# Patient Record
Sex: Female | Born: 1970 | Race: Black or African American | Hispanic: No | Marital: Single
Health system: Southern US, Community
[De-identification: ages and names within clinical notes are randomized; demographics above are authoritative.]

## PROBLEM LIST (undated history)

## (undated) DIAGNOSIS — E7800 Pure hypercholesterolemia, unspecified: Secondary | ICD-10-CM

## (undated) DIAGNOSIS — J302 Other seasonal allergic rhinitis: Secondary | ICD-10-CM

## (undated) HISTORY — DX: Pure hypercholesterolemia, unspecified: E78.00

## (undated) HISTORY — DX: Other seasonal allergic rhinitis: J30.2

## (undated) HISTORY — PX: TUBAL LIGATION: SHX77

---

## 1998-07-26 ENCOUNTER — Inpatient Hospital Stay (HOSPITAL_COMMUNITY): Admission: AD | Admit: 1998-07-26 | Discharge: 1998-07-28 | Payer: Self-pay | Admitting: Obstetrics and Gynecology

## 2000-08-01 ENCOUNTER — Ambulatory Visit (HOSPITAL_COMMUNITY): Admission: RE | Admit: 2000-08-01 | Discharge: 2000-08-01 | Payer: Self-pay | Admitting: Obstetrics

## 2000-09-13 ENCOUNTER — Inpatient Hospital Stay (HOSPITAL_COMMUNITY): Admission: AD | Admit: 2000-09-13 | Discharge: 2000-09-16 | Payer: Self-pay | Admitting: *Deleted

## 2001-03-13 ENCOUNTER — Emergency Department (HOSPITAL_COMMUNITY): Admission: EM | Admit: 2001-03-13 | Discharge: 2001-03-13 | Payer: Self-pay | Admitting: Emergency Medicine

## 2001-03-13 ENCOUNTER — Encounter: Payer: Self-pay | Admitting: Emergency Medicine

## 2012-12-30 ENCOUNTER — Ambulatory Visit (INDEPENDENT_AMBULATORY_CARE_PROVIDER_SITE_OTHER): Payer: 59 | Admitting: Obstetrics

## 2012-12-30 ENCOUNTER — Encounter: Payer: Self-pay | Admitting: Obstetrics

## 2012-12-30 VITALS — BP 110/72 | HR 62 | Temp 97.4°F | Ht 63.0 in | Wt 178.0 lb

## 2012-12-30 DIAGNOSIS — N921 Excessive and frequent menstruation with irregular cycle: Secondary | ICD-10-CM | POA: Insufficient documentation

## 2012-12-30 DIAGNOSIS — Z113 Encounter for screening for infections with a predominantly sexual mode of transmission: Secondary | ICD-10-CM

## 2012-12-30 DIAGNOSIS — Z01419 Encounter for gynecological examination (general) (routine) without abnormal findings: Secondary | ICD-10-CM

## 2012-12-30 NOTE — Progress Notes (Signed)
Subjective:     Erin Olsen is a 42 y.o. female here for a routine exam.  Current complaints: irregular cycles.     Gynecologic History Patient's last menstrual period was 12/30/2012. Contraception: tubal ligation Last Pap: 08/27/2008. Results were: normal Last mammogram: n/a. Results were: n/a  Obstetric History OB History   Grav Para Term Preterm Abortions TAB SAB Ect Mult Living                   The following portions of the patient's history were reviewed and updated as appropriate: allergies, current medications, past family history, past medical history, past social history, past surgical history and problem list.  Review of Systems Pertinent items are noted in HPI.    Objective:    General appearance: alert and no distress Breasts: normal appearance, no masses or tenderness Abdomen: normal findings: soft, non-tender Pelvic: cervix normal in appearance, external genitalia normal, no adnexal masses or tenderness, no cervical motion tenderness, uterus normal size, shape, and consistency and vagina normal without discharge    Assessment:    Healthy female exam.    Plan:    Follow up in: 1 year.

## 2012-12-31 LAB — GC/CHLAMYDIA PROBE AMP
CT Probe RNA: NEGATIVE
GC Probe RNA: NEGATIVE

## 2012-12-31 LAB — PAP IG W/ RFLX HPV ASCU

## 2012-12-31 LAB — HIV ANTIBODY (ROUTINE TESTING W REFLEX): HIV: NONREACTIVE

## 2012-12-31 LAB — RPR

## 2012-12-31 LAB — HEPATITIS B SURFACE ANTIGEN: Hepatitis B Surface Ag: NEGATIVE

## 2013-01-01 ENCOUNTER — Encounter: Payer: Self-pay | Admitting: Obstetrics

## 2013-01-07 ENCOUNTER — Ambulatory Visit (HOSPITAL_COMMUNITY)
Admission: RE | Admit: 2013-01-07 | Discharge: 2013-01-07 | Disposition: A | Discharge status: Home / Self Care | Payer: 59 | Source: Ambulatory Visit | Attending: Obstetrics | Admitting: Obstetrics

## 2013-01-07 DIAGNOSIS — N920 Excessive and frequent menstruation with regular cycle: Secondary | ICD-10-CM | POA: Insufficient documentation

## 2013-01-07 DIAGNOSIS — D259 Leiomyoma of uterus, unspecified: Secondary | ICD-10-CM | POA: Insufficient documentation

## 2013-01-07 DIAGNOSIS — Z01419 Encounter for gynecological examination (general) (routine) without abnormal findings: Secondary | ICD-10-CM

## 2013-01-07 DIAGNOSIS — N921 Excessive and frequent menstruation with irregular cycle: Secondary | ICD-10-CM

## 2013-01-08 ENCOUNTER — Other Ambulatory Visit: Payer: Self-pay | Admitting: Obstetrics

## 2013-01-10 ENCOUNTER — Other Ambulatory Visit: Payer: Self-pay | Admitting: Obstetrics

## 2013-01-10 DIAGNOSIS — R928 Other abnormal and inconclusive findings on diagnostic imaging of breast: Secondary | ICD-10-CM

## 2013-01-21 ENCOUNTER — Other Ambulatory Visit: Payer: Self-pay | Admitting: *Deleted

## 2013-01-21 DIAGNOSIS — N921 Excessive and frequent menstruation with irregular cycle: Secondary | ICD-10-CM

## 2013-01-22 ENCOUNTER — Ambulatory Visit (INDEPENDENT_AMBULATORY_CARE_PROVIDER_SITE_OTHER): Payer: 59

## 2013-01-22 ENCOUNTER — Ambulatory Visit (INDEPENDENT_AMBULATORY_CARE_PROVIDER_SITE_OTHER): Payer: 59 | Admitting: Obstetrics

## 2013-01-22 ENCOUNTER — Other Ambulatory Visit: Payer: Self-pay | Admitting: Obstetrics

## 2013-01-22 VITALS — BP 108/72 | HR 73 | Temp 98.2°F | Wt 175.0 lb

## 2013-01-22 DIAGNOSIS — N946 Dysmenorrhea, unspecified: Secondary | ICD-10-CM

## 2013-01-22 DIAGNOSIS — B9689 Other specified bacterial agents as the cause of diseases classified elsewhere: Secondary | ICD-10-CM

## 2013-01-22 DIAGNOSIS — N8500 Endometrial hyperplasia, unspecified: Secondary | ICD-10-CM

## 2013-01-22 DIAGNOSIS — N921 Excessive and frequent menstruation with irregular cycle: Secondary | ICD-10-CM

## 2013-01-22 DIAGNOSIS — Z3202 Encounter for pregnancy test, result negative: Secondary | ICD-10-CM

## 2013-01-22 DIAGNOSIS — N760 Acute vaginitis: Secondary | ICD-10-CM

## 2013-01-22 DIAGNOSIS — D259 Leiomyoma of uterus, unspecified: Secondary | ICD-10-CM

## 2013-01-22 DIAGNOSIS — Z01812 Encounter for preprocedural laboratory examination: Secondary | ICD-10-CM

## 2013-01-22 LAB — POCT URINE PREGNANCY: Preg Test, Ur: NEGATIVE

## 2013-01-22 MED ORDER — DOXYCYCLINE HYCLATE 50 MG PO CAPS: 50.0000 mg | ORAL_CAPSULE | Freq: Two times a day (BID) | ORAL | Status: AC

## 2013-01-22 MED ORDER — METRONIDAZOLE 500 MG PO TABS
500.0000 mg | ORAL_TABLET | Freq: Two times a day (BID) | ORAL | Status: DC
Start: 2013-01-22 — End: 2014-11-20

## 2013-01-22 NOTE — Progress Notes (Signed)
SONOHYSTEROGRAM PROCEDURE NOTE  SONOHYSTEROGRAM PROCEDURE:   A time-out was performed confirming patient name, allergy status and procedure.  A UPT was negative.  A Graves speculum was placed in the vagina.  The cervix was prepped with betadine.  The cervix was grasped with a single tooth tenaculum.  The IUI catherter was primed with sterile water.  The IUI was introduced into the uterine cavity.  The single tooth tenaculum and speculum were removed.  The vaginal probe was placed.  The sterile water was instilled in the uterine cavity.   The 60ml syringe was removed and a 10ml syringe was attached to the IUI and an endometrial biopsy was obtained with aspiration and submitted to pathology. The IUI was removed.  The single tooth tenaculum was removed with minimal bleeding noted from the cervix.  The patient tolerated the procedure well.  FINDINGS:  Normal endometrial cavity.  No polyps or submucous fibroids seen.

## 2013-01-23 ENCOUNTER — Encounter: Payer: Self-pay | Admitting: Obstetrics

## 2013-01-23 LAB — WET PREP BY MOLECULAR PROBE
Candida species: NEGATIVE
Gardnerella vaginalis: POSITIVE — AB

## 2013-01-28 ENCOUNTER — Ambulatory Visit
Admission: RE | Admit: 2013-01-28 | Discharge: 2013-01-28 | Disposition: A | Discharge status: Home / Self Care | Payer: 59 | Source: Ambulatory Visit | Attending: Obstetrics | Admitting: Obstetrics

## 2013-01-28 DIAGNOSIS — R928 Other abnormal and inconclusive findings on diagnostic imaging of breast: Secondary | ICD-10-CM

## 2013-02-03 ENCOUNTER — Encounter: Payer: Self-pay | Admitting: Obstetrics

## 2013-12-29 ENCOUNTER — Ambulatory Visit: Payer: 59 | Admitting: Obstetrics

## 2014-09-10 IMAGING — US US PELVIS COMPLETE
1 series · 13 of 25 positions shown · non-contrast
Comparison: None

CLINICAL DATA: Menorrhagia.  LMP 12/30/2012.



[Series 1: us pelvis complete · 13 of 51 slices shown]
[im 1/51]
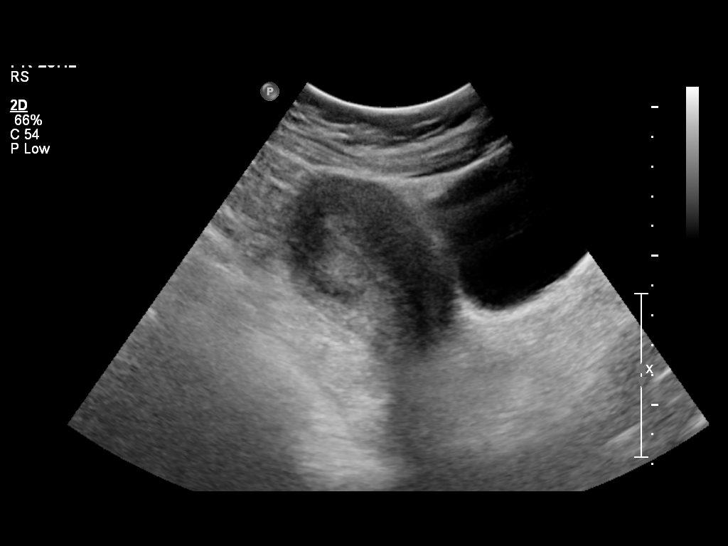
[im 5/51]
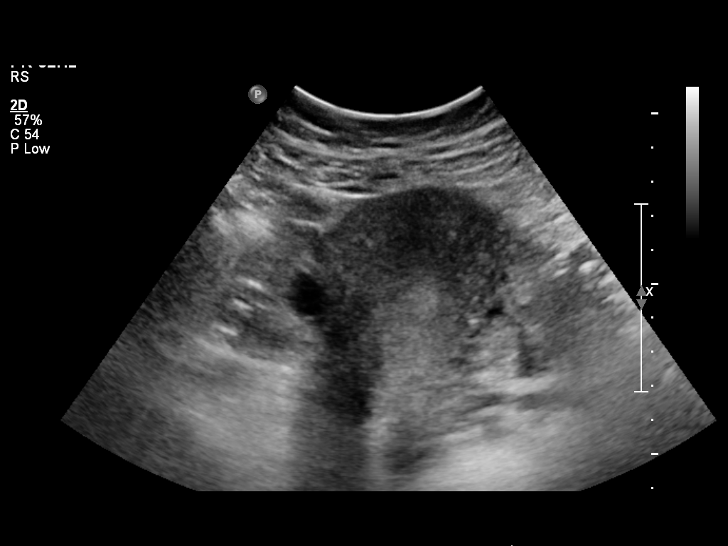
[im 9/51]
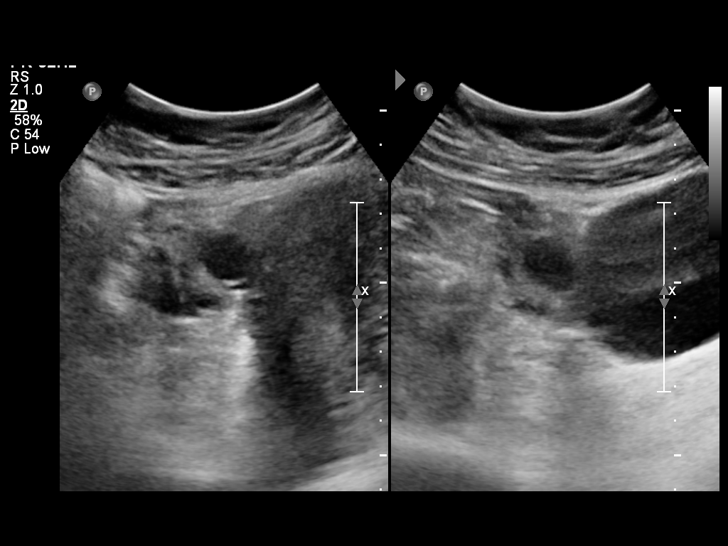
[im 13/51]
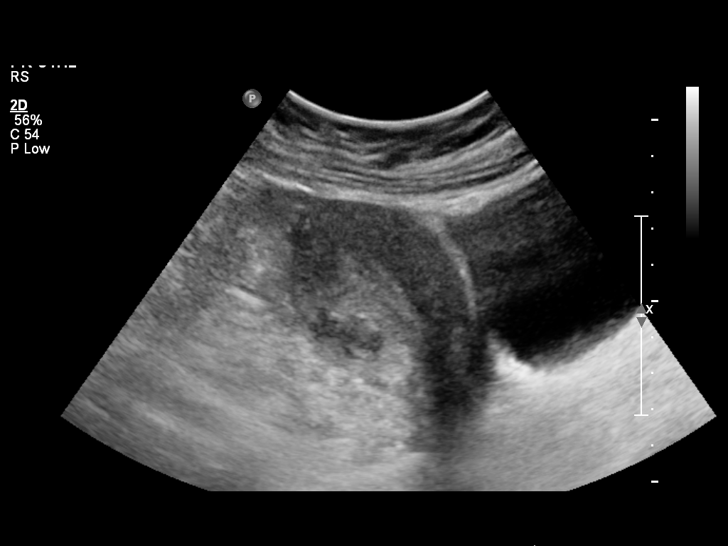
[im 17/51]
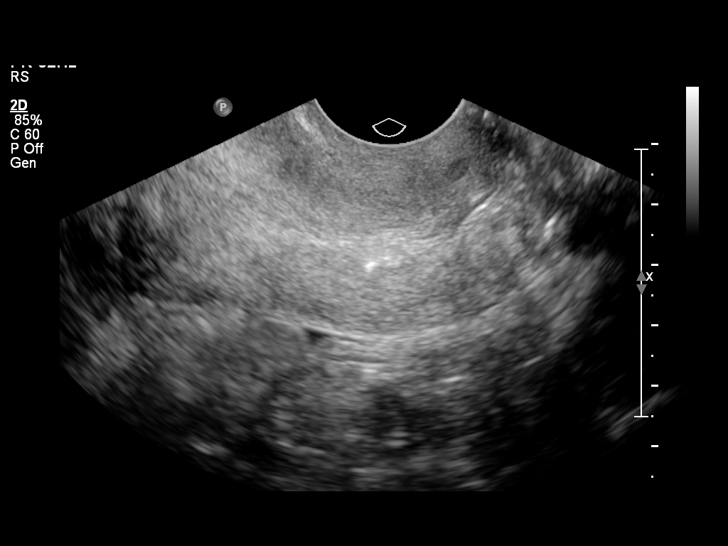
[im 21/51]
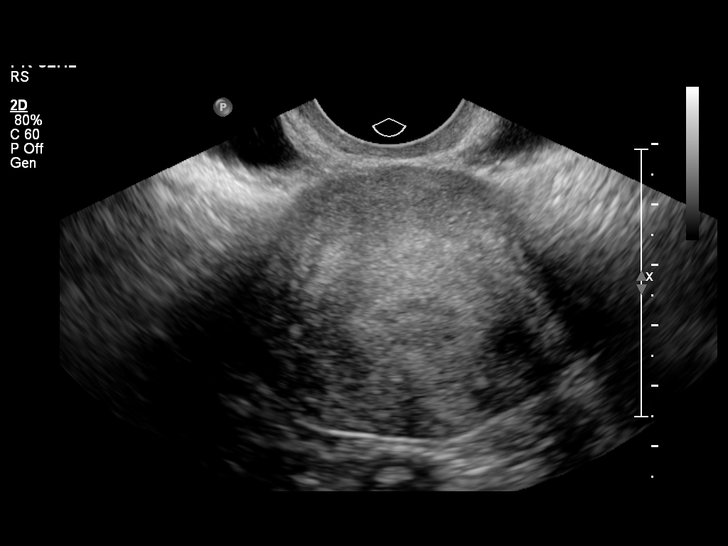
[im 26/51]
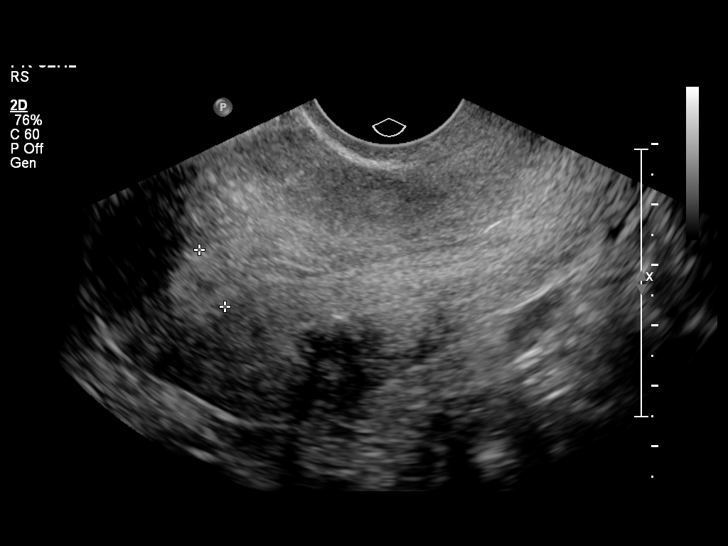
[im 30/51]
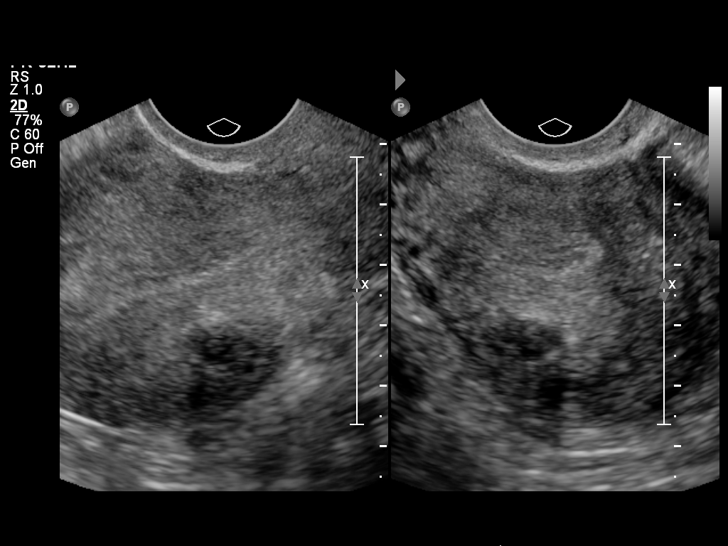
[im 34/51]
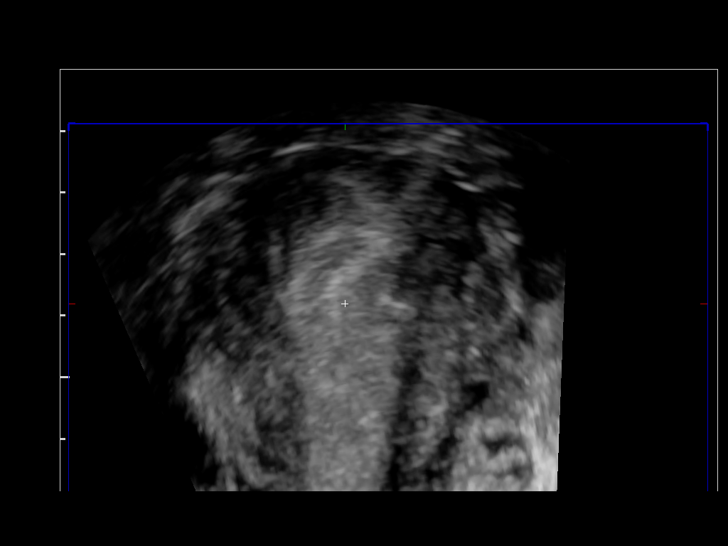
[im 38/51]
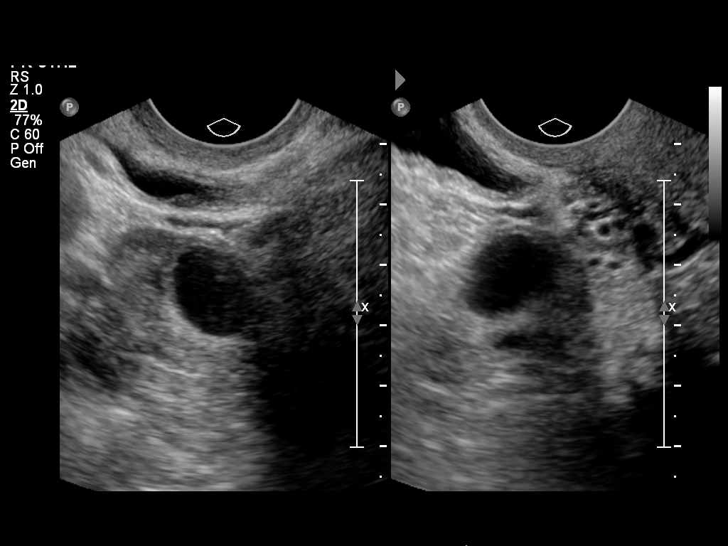
[im 42/51]
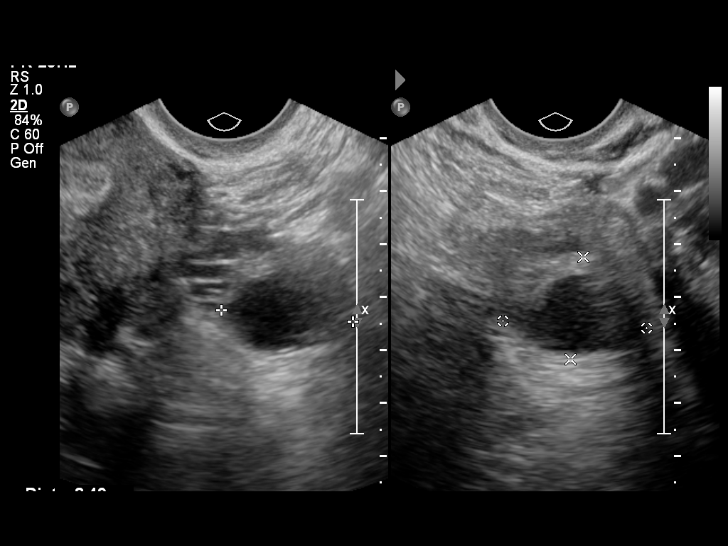
[im 46/51]
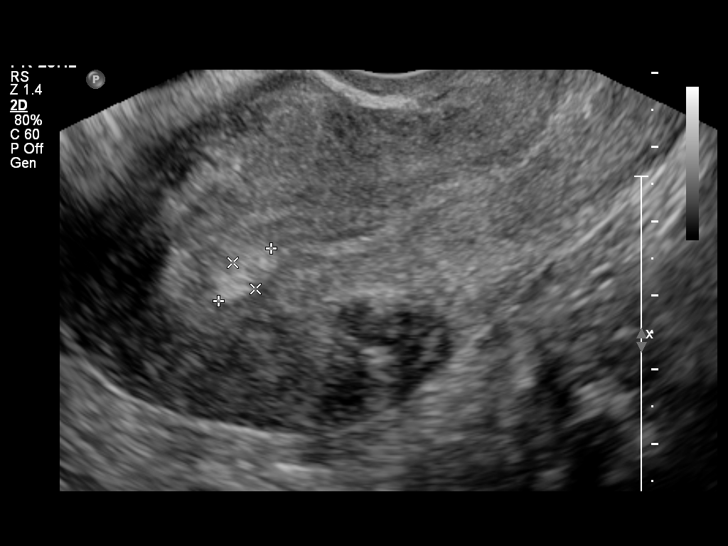
[im 51/51]
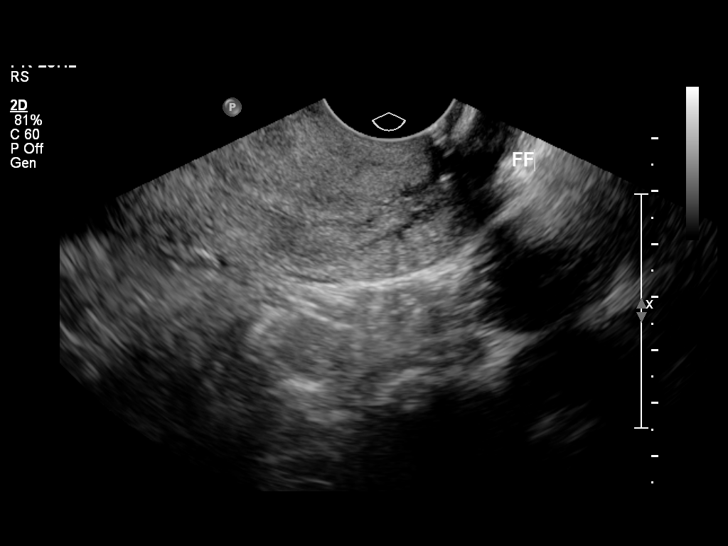

[13 of 25 positions shown; findings below may reference images not displayed]

FINDINGS: Uterus: Is anteverted and anteflexed and demonstrates a sagittal
length of 8.4 cm, depth of 4.6 cm and width of 5.5 cm. A focal
fibroid is identified in the posterior upper uterine segment
measuring 1.5 x 1.8 x 1.4 cm, mural in location

Endometrium: Demonstrates a late tri-layered pattern with a width
of 10.3 mm.  A focal area of increased echogenicity is seen in the
left fundal region measuring 1.0 x 0.5 x 0.9 cm.  Color Doppler
evaluation does not demonstrate a feeding vessel but the appearance
is suspicious for a focal polyp.

Right ovary:  Measures 3.2 x 2.5 x 2.2 cm and contains a dominant
follicle

Left ovary: Measures 2.5 x 2.0 x 2.7 cm and has a normal appearance

Other findings: No pelvic fluid or separate adnexal masses are
seen.
IMPRESSION: Focal area of increased echogenicity within the endometrial lining
is suspicious for a polyp.  Further evaluation can be performed
with sonohysterography for complete assessment.

Focal fibroid.

Normal ovaries..

## 2014-11-17 ENCOUNTER — Other Ambulatory Visit (HOSPITAL_COMMUNITY)
Admission: RE | Admit: 2014-11-17 | Discharge: 2014-11-17 | Disposition: A | Discharge status: Home / Self Care | Payer: Self-pay | Source: Ambulatory Visit | Attending: Family Medicine | Admitting: Family Medicine

## 2014-11-17 ENCOUNTER — Emergency Department (HOSPITAL_COMMUNITY)
Admission: EM | Admit: 2014-11-17 | Discharge: 2014-11-17 | Disposition: A | Discharge status: Home / Self Care | Payer: Self-pay | Source: Home / Self Care | Attending: Family Medicine | Admitting: Family Medicine

## 2014-11-17 ENCOUNTER — Encounter (HOSPITAL_COMMUNITY): Payer: Self-pay | Admitting: Emergency Medicine

## 2014-11-17 DIAGNOSIS — N760 Acute vaginitis: Secondary | ICD-10-CM | POA: Insufficient documentation

## 2014-11-17 DIAGNOSIS — N938 Other specified abnormal uterine and vaginal bleeding: Secondary | ICD-10-CM

## 2014-11-17 DIAGNOSIS — Z113 Encounter for screening for infections with a predominantly sexual mode of transmission: Secondary | ICD-10-CM | POA: Insufficient documentation

## 2014-11-17 LAB — POCT PREGNANCY, URINE: Preg Test, Ur: NEGATIVE

## 2014-11-17 NOTE — ED Notes (Signed)
Patient reports irregular bleeding.  Patient reports irregular bleeding for 3 months, the last 3 days bleeding has significantly increased.  Patient has been seeing dr Jodi Mourning for the same and has been placed on bcp, patient is on a second type of bcp.  Reports she has an outstanding balance on acct with dr Jodi Mourning and will have to take care of this prior to being seen in dr harper office.  Patient here today to get bleeding to stop.

## 2014-11-17 NOTE — ED Provider Notes (Signed)
CSN: [id number]     Arrival date & time 11/17/14  0945 History   First MD Initiated Contact with Patient 11/17/14 1015     Chief Complaint  Patient presents with  . Vaginal Bleeding   (Consider location/radiation/quality/duration/timing/severity/associated sxs/prior Treatment) HPI Comments: Patient states she began taking OCPs for irregular menses in March 2016. These were prescribed for irregular menses. States she took the first 3 [place] of blue pills and then began the fourth week of white pills and began bleeding and states bleeding has been heavy. Is here because she does not understand why she is bleeding. She was under the impression that OCPs would inhibit vaginal bleeding all together. Has ObGyn and PCP, but has not followed up with either provider for re-evaluation.  Endorses remote BTL.   Patient is a 44 y.o. female presenting with vaginal bleeding. The history is provided by the patient.  Vaginal Bleeding Quality:  Dark red and clots   Past Medical History  Diagnosis Date  . Elevated LDL cholesterol level   . Seasonal allergies    Past Surgical History  Procedure Laterality Date  . Tubal ligation     Family History  Problem Relation Age of Onset  . Tuberculosis Father   . HIV Father    History  Substance Use Topics  . Smoking status: Passive Smoke Exposure - Never Smoker  . Smokeless tobacco: Never Used  . Alcohol Use: Yes     Comment: rare   OB History    No data available     Review of Systems  Genitourinary: Positive for vaginal bleeding.  All other systems reviewed and are negative.   Allergies  Review of patient's allergies indicates no known allergies.  Home Medications   Prior to Admission medications   Medication Sig Start Date End Date Taking? Authorizing Provider  cetirizine (ZYRTEC) 10 MG tablet Take 10 mg by mouth daily.   Yes Historical Provider, MD  aspirin 81 MG tablet Take 81 mg by mouth daily.    Historical Provider, MD  doxycycline  (VIBRAMYCIN) 50 MG capsule Take 1 capsule (50 mg total) by mouth 2 (two) times daily. 01/22/13   Shelly Bombard, MD  fexofenadine (ALLEGRA) 180 MG tablet Take 180 mg by mouth daily.    Historical Provider, MD  metroNIDAZOLE (FLAGYL) 500 MG tablet Take 1 tablet (500 mg total) by mouth 2 (two) times daily. 01/22/13   Shelly Bombard, MD  pravastatin (PRAVACHOL) 20 MG tablet Take 20 mg by mouth daily.    Historical Provider, MD   BP 122/83 mmHg  Pulse 81  Temp(Src) 98.4 F (36.9 C) (Oral)  Resp 16  SpO2 98% Physical Exam  Constitutional: She appears well-developed and well-nourished. No distress.  HENT:  Head: Normocephalic and atraumatic.  Cardiovascular: Normal rate.   Pulmonary/Chest: Effort normal.  Genitourinary: Pelvic exam was performed with patient supine. There is no rash, tenderness, lesion or injury on the right labia. There is no rash, tenderness, lesion or injury on the left labia. Uterus is enlarged. Uterus is not deviated, not fixed and not tender. Cervix exhibits no motion tenderness, no discharge and no friability. Right adnexum displays no mass, no tenderness and no fullness. Left adnexum displays no mass, no tenderness and no fullness. There is bleeding in the vagina. No erythema or tenderness in the vagina. No foreign body around the vagina. No signs of injury around the vagina. No vaginal discharge found.  Bleeding consistent with normal menstrual cycle. Blood appears to be  coming from cervical os Bimanual exam reveals palpable nontender firm thickened right side of uterus  Skin: Skin is warm and dry.  Psychiatric: She has a normal mood and affect. Her behavior is normal.  Nursing note and vitals reviewed.   ED Course  Procedures (including critical care time) Labs Review Labs Reviewed  POCT PREGNANCY, URINE  CERVICOVAGINAL ANCILLARY ONLY    Imaging Review No results found.   MDM   1. Dysfunctional uterine bleeding   While patient endorses a known hx of  thickened uterine wall based upon hystosonogram performed by Dr. Jodi Mourning, I am concerned that she continues with irregular bleeding and that this region is now large enough to be felt during bimanual exam. Patient strongly encouraged to follow up with Dr. Jodi Mourning [facility]) for further evaluation. UPT negative.    Lutricia Feil, [place] 11/17/14 [phone]

## 2014-11-17 NOTE — Discharge Instructions (Signed)
You have a palpable thickening of your uterus and this may be responsible for your irregular bleeding. I would strongly encourgae you to follow up with your ObGyn provider to undergo additional evaluation. Your pregnancy test was negative Abnormal Uterine Bleeding Abnormal uterine bleeding can affect women at various stages in life, including teenagers, women in their reproductive years, pregnant women, and women who have reached menopause. Several kinds of uterine bleeding are considered abnormal, including:  Bleeding or spotting between periods.   Bleeding after sexual intercourse.   Bleeding that is heavier or more than normal.   Periods that last longer than usual.  Bleeding after menopause.  Many cases of abnormal uterine bleeding are minor and simple to treat, while others are more serious. Any type of abnormal bleeding should be evaluated by your health care provider. Treatment will depend on the cause of the bleeding. HOME CARE INSTRUCTIONS Monitor your condition for any changes. The following actions may help to alleviate any discomfort you are experiencing:  Avoid the use of tampons and douches as directed by your health care provider.  Change your pads frequently. You should get regular pelvic exams and Pap tests. Keep all follow-up appointments for diagnostic tests as directed by your health care provider.  SEEK MEDICAL CARE IF:   Your bleeding lasts more than 1 week.   You feel dizzy at times.  SEEK IMMEDIATE MEDICAL CARE IF:   You pass out.   You are changing pads every 15 to 30 minutes.   You have abdominal pain.  You have a fever.   You become sweaty or weak.   You are passing large blood clots from the vagina.   You start to feel nauseous and vomit. MAKE SURE YOU:   Understand these instructions.  Will watch your condition.  Will get help right away if you are not doing well or get worse. Document Released: 07/31/2005 Document Revised:  08/05/2013 Document Reviewed: 02/27/2013 [facility] Patient Information 2015 [place], [place]. This information is not intended to replace advice given to you by your health care provider. Make sure you discuss any questions you have with your health care provider.

## 2014-11-18 LAB — CERVICOVAGINAL ANCILLARY ONLY
CHLAMYDIA, DNA PROBE: NEGATIVE
NEISSERIA GONORRHEA: NEGATIVE
WET PREP (BD AFFIRM): POSITIVE — AB

## 2014-11-20 MED ORDER — METRONIDAZOLE 500 MG PO TABS: 500.0000 mg | ORAL_TABLET | Freq: Two times a day (BID) | ORAL | Status: AC

## 2014-11-20 NOTE — Progress Notes (Signed)
11/20/2014: Lab results +for trichomonas. Rx for 1 week of flagyl sent electronically to patient's pharmacy. RN to notify of results and new Rx.

## 2014-11-24 ENCOUNTER — Telehealth (HOSPITAL_COMMUNITY): Payer: Self-pay | Admitting: *Deleted

## 2014-11-24 NOTE — ED Notes (Addendum)
GC/Chlamydia neg, Trich pos. 4/8 Message from [facility] PA that she did an order for Flagyl.  I called pt. and left a message to call. Call 1. Roselyn Meier 11/24/2014 I called pt. Pt. verified x 2 and given results.  Pt. told she needs Flagyl for Trich and where to pick up her Rx.    Pt. instructed to no alcohol while taking this medication.  She is in [place] now and wants the Rx. sent to the [place] there. I told her she can go to the pharmacy and get it transferred there. She asked how long she has had it. I told her there is no way of knowing that.  Instructed to notify her partner to be treated with Flagyl, no sex until she has finished her medication and her partner has been treated and to practice safe sex.  Pt. Did not have any questions. 11/26/2014
